# Patient Record
Sex: Female | Born: 1955 | Race: White | Hispanic: No | Marital: Married | Smoking: Never smoker
Health system: Southern US, Community
[De-identification: ages and names within clinical notes are randomized; demographics above are authoritative.]

## PROBLEM LIST (undated history)

## (undated) DIAGNOSIS — I10 Essential (primary) hypertension: Secondary | ICD-10-CM

## (undated) HISTORY — PX: BREAST CYST ASPIRATION: SHX578

## (undated) HISTORY — PX: ABDOMINAL HYSTERECTOMY: SHX81

## (undated) HISTORY — PX: BREAST BIOPSY: SHX20

---

## 2004-03-17 ENCOUNTER — Ambulatory Visit: Payer: Self-pay | Admitting: Obstetrics and Gynecology

## 2004-04-29 ENCOUNTER — Ambulatory Visit: Payer: Self-pay | Admitting: Unknown Physician Specialty

## 2005-05-04 ENCOUNTER — Ambulatory Visit: Payer: Self-pay | Admitting: Obstetrics and Gynecology

## 2006-05-10 ENCOUNTER — Ambulatory Visit: Payer: Self-pay | Admitting: Obstetrics and Gynecology

## 2007-05-12 ENCOUNTER — Ambulatory Visit: Payer: Self-pay | Admitting: Obstetrics and Gynecology

## 2008-06-06 ENCOUNTER — Ambulatory Visit: Payer: Self-pay

## 2009-06-19 ENCOUNTER — Ambulatory Visit: Payer: Self-pay

## 2010-06-23 ENCOUNTER — Ambulatory Visit: Payer: Self-pay

## 2011-06-23 ENCOUNTER — Ambulatory Visit: Payer: Self-pay | Admitting: Family Medicine

## 2011-06-25 ENCOUNTER — Ambulatory Visit: Payer: Self-pay

## 2012-07-12 ENCOUNTER — Ambulatory Visit: Payer: Self-pay

## 2012-08-26 ENCOUNTER — Ambulatory Visit: Payer: Self-pay | Admitting: Family Medicine

## 2012-08-26 LAB — URINALYSIS, COMPLETE
Bacteria: NEGATIVE
Bilirubin,UR: NEGATIVE
Blood: NEGATIVE
Ketone: NEGATIVE
Leukocyte Esterase: NEGATIVE
Nitrite: NEGATIVE
Ph: 6.5 (ref 4.5–8.0)
Protein: NEGATIVE

## 2012-08-26 LAB — COMPREHENSIVE METABOLIC PANEL
Anion Gap: 10 (ref 7–16)
BUN: 8 mg/dL (ref 7–18)
Bilirubin,Total: 0.7 mg/dL (ref 0.2–1.0)
Calcium, Total: 9.6 mg/dL (ref 8.5–10.1)
Chloride: 100 mmol/L (ref 98–107)
EGFR (Non-African Amer.): >60
Glucose: 103 mg/dL — ABNORMAL HIGH (ref 65–99)
Osmolality: 272 (ref 275–301)
Potassium: 3.8 mmol/L (ref 3.5–5.1)
Total Protein: 9.2 g/dL — ABNORMAL HIGH (ref 6.4–8.2)

## 2012-08-26 LAB — CBC WITH DIFFERENTIAL/PLATELET
Basophil #: 0 10*3/uL (ref 0.0–0.1)
Eosinophil %: 0.8 %
HCT: 42.1 % (ref 35.0–47.0)
HGB: 14.1 g/dL (ref 12.0–16.0)
Lymphocyte #: 2.4 10*3/uL (ref 1.0–3.6)
MCV: 88 fL (ref 80–100)
Monocyte #: 0.7 x10 3/mm (ref 0.2–0.9)
Monocyte %: 5.4 %
Neutrophil #: 9 10*3/uL — ABNORMAL HIGH (ref 1.4–6.5)
Neutrophil %: 73.6 %
Platelet: 264 10*3/uL (ref 150–440)
WBC: 12.2 10*3/uL — ABNORMAL HIGH (ref 3.6–11.0)

## 2012-08-26 LAB — LIPASE, BLOOD: Lipase: 114 U/L (ref 73–393)

## 2013-08-17 ENCOUNTER — Ambulatory Visit: Payer: Self-pay

## 2014-02-23 ENCOUNTER — Emergency Department: Payer: Self-pay | Admitting: Emergency Medicine

## 2014-02-23 LAB — BASIC METABOLIC PANEL
ANION GAP: 8 (ref 7–16)
BUN: 10 mg/dL (ref 7–18)
CO2: 27 mmol/L (ref 21–32)
Calcium, Total: 8.4 mg/dL — ABNORMAL LOW (ref 8.5–10.1)
Chloride: 100 mmol/L (ref 98–107)
Creatinine: 0.61 mg/dL (ref 0.60–1.30)
EGFR (African American): >60
EGFR (Non-African Amer.): >60
Glucose: 117 mg/dL — ABNORMAL HIGH (ref 65–99)
Osmolality: 270 (ref 275–301)
Potassium: 3.1 mmol/L — ABNORMAL LOW (ref 3.5–5.1)
Sodium: 135 mmol/L — ABNORMAL LOW (ref 136–145)

## 2014-02-23 LAB — CBC WITH DIFFERENTIAL/PLATELET
BASOS PCT: 0.9 %
Basophil #: 0.1 10*3/uL (ref 0.0–0.1)
EOS ABS: 0.1 10*3/uL (ref 0.0–0.7)
Eosinophil %: 1.5 %
HCT: 38.1 % (ref 35.0–47.0)
HGB: 13 g/dL (ref 12.0–16.0)
LYMPHS ABS: 3.6 10*3/uL (ref 1.0–3.6)
Lymphocyte %: 43.5 %
MCH: 30 pg (ref 26.0–34.0)
MCHC: 34.1 g/dL (ref 32.0–36.0)
MCV: 88 fL (ref 80–100)
MONO ABS: 0.5 x10 3/mm (ref 0.2–0.9)
Monocyte %: 5.9 %
Neutrophil #: 3.9 10*3/uL (ref 1.4–6.5)
Neutrophil %: 48.2 %
Platelet: 264 10*3/uL (ref 150–440)
RBC: 4.33 10*6/uL (ref 3.80–5.20)
RDW: 12.9 % (ref 11.5–14.5)
WBC: 8.2 10*3/uL (ref 3.6–11.0)

## 2014-02-23 LAB — TROPONIN I

## 2014-08-14 ENCOUNTER — Other Ambulatory Visit: Payer: Self-pay | Admitting: Nurse Practitioner

## 2014-08-14 DIAGNOSIS — Z1231 Encounter for screening mammogram for malignant neoplasm of breast: Secondary | ICD-10-CM

## 2014-08-21 ENCOUNTER — Ambulatory Visit: Admission: RE | Admit: 2014-08-21 | Payer: Self-pay | Source: Ambulatory Visit

## 2014-08-21 ENCOUNTER — Other Ambulatory Visit: Payer: Self-pay | Admitting: Nurse Practitioner

## 2014-08-21 ENCOUNTER — Ambulatory Visit
Admission: RE | Admit: 2014-08-21 | Discharge: 2014-08-21 | Disposition: A | Discharge status: Home / Self Care | Payer: 59 | Source: Ambulatory Visit | Attending: Nurse Practitioner | Admitting: Nurse Practitioner

## 2014-08-21 DIAGNOSIS — Z1231 Encounter for screening mammogram for malignant neoplasm of breast: Secondary | ICD-10-CM

## 2015-03-31 ENCOUNTER — Encounter: Admission: RE | Payer: Self-pay | Source: Ambulatory Visit

## 2015-03-31 ENCOUNTER — Ambulatory Visit: Admission: RE | Admit: 2015-03-31 | Payer: 59 | Source: Ambulatory Visit | Admitting: Gastroenterology

## 2015-03-31 SURGERY — COLONOSCOPY WITH PROPOFOL
Anesthesia: General

## 2015-08-27 ENCOUNTER — Other Ambulatory Visit: Payer: Self-pay | Admitting: Nurse Practitioner

## 2015-08-27 DIAGNOSIS — Z1231 Encounter for screening mammogram for malignant neoplasm of breast: Secondary | ICD-10-CM

## 2015-09-09 ENCOUNTER — Ambulatory Visit: Admission: RE | Admit: 2015-09-09 | Payer: 59 | Source: Ambulatory Visit

## 2015-09-11 ENCOUNTER — Ambulatory Visit
Admission: RE | Admit: 2015-09-11 | Discharge: 2015-09-11 | Disposition: A | Discharge status: Home / Self Care | Payer: 59 | Source: Ambulatory Visit | Attending: Nurse Practitioner | Admitting: Nurse Practitioner

## 2015-09-11 ENCOUNTER — Other Ambulatory Visit: Payer: Self-pay | Admitting: Nurse Practitioner

## 2015-09-11 DIAGNOSIS — Z1231 Encounter for screening mammogram for malignant neoplasm of breast: Secondary | ICD-10-CM

## 2016-02-27 DIAGNOSIS — Z79899 Other long term (current) drug therapy: Secondary | ICD-10-CM | POA: Diagnosis not present

## 2016-02-27 DIAGNOSIS — R7302 Impaired glucose tolerance (oral): Secondary | ICD-10-CM | POA: Diagnosis not present

## 2016-02-27 DIAGNOSIS — I10 Essential (primary) hypertension: Secondary | ICD-10-CM | POA: Diagnosis not present

## 2016-02-27 DIAGNOSIS — E7800 Pure hypercholesterolemia, unspecified: Secondary | ICD-10-CM | POA: Diagnosis not present

## 2016-03-03 DIAGNOSIS — M8588 Other specified disorders of bone density and structure, other site: Secondary | ICD-10-CM | POA: Diagnosis not present

## 2016-03-11 DIAGNOSIS — M159 Polyosteoarthritis, unspecified: Secondary | ICD-10-CM | POA: Diagnosis not present

## 2016-03-11 DIAGNOSIS — M542 Cervicalgia: Secondary | ICD-10-CM | POA: Diagnosis not present

## 2016-03-16 DIAGNOSIS — M159 Polyosteoarthritis, unspecified: Secondary | ICD-10-CM | POA: Diagnosis not present

## 2016-03-16 DIAGNOSIS — M542 Cervicalgia: Secondary | ICD-10-CM | POA: Diagnosis not present

## 2016-03-25 DIAGNOSIS — M159 Polyosteoarthritis, unspecified: Secondary | ICD-10-CM | POA: Diagnosis not present

## 2016-03-25 DIAGNOSIS — M542 Cervicalgia: Secondary | ICD-10-CM | POA: Diagnosis not present

## 2016-03-30 DIAGNOSIS — M542 Cervicalgia: Secondary | ICD-10-CM | POA: Diagnosis not present

## 2016-04-08 DIAGNOSIS — M542 Cervicalgia: Secondary | ICD-10-CM | POA: Diagnosis not present

## 2016-04-13 DIAGNOSIS — M542 Cervicalgia: Secondary | ICD-10-CM | POA: Diagnosis not present

## 2016-04-13 DIAGNOSIS — M159 Polyosteoarthritis, unspecified: Secondary | ICD-10-CM | POA: Diagnosis not present

## 2016-04-20 DIAGNOSIS — M542 Cervicalgia: Secondary | ICD-10-CM | POA: Diagnosis not present

## 2016-06-11 DIAGNOSIS — R194 Change in bowel habit: Secondary | ICD-10-CM | POA: Diagnosis not present

## 2016-09-06 ENCOUNTER — Other Ambulatory Visit: Payer: Self-pay | Admitting: Nurse Practitioner

## 2016-09-06 DIAGNOSIS — Z1231 Encounter for screening mammogram for malignant neoplasm of breast: Secondary | ICD-10-CM

## 2016-09-15 ENCOUNTER — Ambulatory Visit
Admission: RE | Admit: 2016-09-15 | Discharge: 2016-09-15 | Disposition: A | Discharge status: Home / Self Care | Payer: 59 | Source: Ambulatory Visit | Attending: Nurse Practitioner | Admitting: Nurse Practitioner

## 2016-09-15 DIAGNOSIS — Z1231 Encounter for screening mammogram for malignant neoplasm of breast: Secondary | ICD-10-CM | POA: Diagnosis present

## 2017-03-09 DIAGNOSIS — R7302 Impaired glucose tolerance (oral): Secondary | ICD-10-CM | POA: Diagnosis not present

## 2017-03-09 DIAGNOSIS — I10 Essential (primary) hypertension: Secondary | ICD-10-CM | POA: Diagnosis not present

## 2017-03-09 DIAGNOSIS — Z79899 Other long term (current) drug therapy: Secondary | ICD-10-CM | POA: Diagnosis not present

## 2017-03-09 DIAGNOSIS — E7800 Pure hypercholesterolemia, unspecified: Secondary | ICD-10-CM | POA: Diagnosis not present

## 2017-09-06 ENCOUNTER — Other Ambulatory Visit: Payer: Self-pay | Admitting: Nurse Practitioner

## 2017-09-06 DIAGNOSIS — Z1231 Encounter for screening mammogram for malignant neoplasm of breast: Secondary | ICD-10-CM

## 2017-10-18 ENCOUNTER — Encounter (INDEPENDENT_AMBULATORY_CARE_PROVIDER_SITE_OTHER): Payer: Self-pay

## 2017-10-18 ENCOUNTER — Ambulatory Visit
Admission: RE | Admit: 2017-10-18 | Discharge: 2017-10-18 | Disposition: A | Discharge status: Home / Self Care | Payer: 59 | Source: Ambulatory Visit | Attending: Nurse Practitioner | Admitting: Nurse Practitioner

## 2017-10-18 DIAGNOSIS — Z1231 Encounter for screening mammogram for malignant neoplasm of breast: Secondary | ICD-10-CM | POA: Insufficient documentation

## 2018-09-13 ENCOUNTER — Other Ambulatory Visit: Payer: Self-pay | Admitting: Nurse Practitioner

## 2018-09-13 DIAGNOSIS — Z1231 Encounter for screening mammogram for malignant neoplasm of breast: Secondary | ICD-10-CM

## 2018-10-23 ENCOUNTER — Other Ambulatory Visit: Payer: Self-pay

## 2018-10-23 ENCOUNTER — Ambulatory Visit
Admission: RE | Admit: 2018-10-23 | Discharge: 2018-10-23 | Disposition: A | Discharge status: Home / Self Care | Payer: 59 | Source: Ambulatory Visit | Attending: Nurse Practitioner | Admitting: Nurse Practitioner

## 2018-10-23 DIAGNOSIS — Z1231 Encounter for screening mammogram for malignant neoplasm of breast: Secondary | ICD-10-CM | POA: Diagnosis not present

## 2019-10-09 ENCOUNTER — Other Ambulatory Visit: Payer: Self-pay | Admitting: Nurse Practitioner

## 2019-10-09 DIAGNOSIS — Z1231 Encounter for screening mammogram for malignant neoplasm of breast: Secondary | ICD-10-CM

## 2019-10-24 ENCOUNTER — Ambulatory Visit: Payer: 59

## 2019-12-17 ENCOUNTER — Other Ambulatory Visit: Payer: Self-pay

## 2019-12-17 ENCOUNTER — Ambulatory Visit
Admission: RE | Admit: 2019-12-17 | Discharge: 2019-12-17 | Disposition: A | Discharge status: Home / Self Care | Payer: Medicare Other | Source: Ambulatory Visit | Attending: Nurse Practitioner | Admitting: Nurse Practitioner

## 2019-12-17 DIAGNOSIS — Z1231 Encounter for screening mammogram for malignant neoplasm of breast: Secondary | ICD-10-CM | POA: Diagnosis not present

## 2020-08-02 ENCOUNTER — Other Ambulatory Visit: Payer: Self-pay

## 2020-08-02 ENCOUNTER — Encounter: Payer: Self-pay | Admitting: Emergency Medicine

## 2020-08-02 ENCOUNTER — Ambulatory Visit
Admission: EM | Admit: 2020-08-02 | Discharge: 2020-08-02 | Disposition: A | Discharge status: Home / Self Care | Payer: Medicare Other | Attending: Family Medicine | Admitting: Family Medicine

## 2020-08-02 DIAGNOSIS — J0100 Acute maxillary sinusitis, unspecified: Secondary | ICD-10-CM

## 2020-08-02 MED ORDER — AMOXICILLIN-POT CLAVULANATE 875-125 MG PO TABS: 1.0000 | ORAL_TABLET | Freq: Two times a day (BID) | ORAL | 0 refills | Status: DC

## 2020-08-02 NOTE — ED Triage Notes (Signed)
Patient c/o sinus congestion and pressure that started 3 weeks ago.  Patient states that last night her teeth started hurting. Patient denies fevers.

## 2020-08-02 NOTE — ED Provider Notes (Signed)
[facility]    CSN: [id number] Arrival date & time: 08/02/20  0906      History   Chief Complaint Chief Complaint  Patient presents with   Sinus Problem    HPI 65 year old female presents with the above complaint.  3 week history of sinus pain, pressure, and congestion.  She has been using a Nettie pot without resolution.  She is now experiencing some associated dental pain.  Denies fever.  Feels poorly.  No known inciting factor.  No known exacerbating factors.  No other respiratory symptoms.  No other complaints.  Past Surgical History:  Procedure Laterality Date   ABDOMINAL HYSTERECTOMY     BREAST BIOPSY Right    neg   BREAST CYST ASPIRATION Left    neg    OB History   No obstetric history on file.      Home Medications    Prior to Admission medications   Medication Sig Start Date End Date Taking? Authorizing Provider  amoxicillin-clavulanate (AUGMENTIN) 875-125 MG tablet Take 1 tablet by mouth 2 (two) times daily. 08/02/20  Yes Wadsworth Skolnick G, DO  lisinopril (ZESTRIL) 20 MG tablet Take 20 mg by mouth 2 (two) times daily. 07/12/20  Yes [provider]  omeprazole (PRILOSEC OTC) 20 MG tablet Take by mouth.   Yes [provider]  traMADol (ULTRAM) 50 MG tablet Take 50 mg by mouth 2 (two) times daily as needed. 05/27/20  Yes [provider]    Family History Family History  Problem Relation Age of Onset   Breast cancer Sister 73    Social History Social History   Tobacco Use   Smoking status: Never   Smokeless tobacco: Never  Vaping Use   Vaping Use: Never used  Substance Use Topics   Alcohol use: Not Currently     Allergies   Statins   Review of Systems Review of Systems  Constitutional:  Negative for fever.  HENT:  Positive for congestion, sinus pressure and sinus pain.     Physical Exam Triage Vital Signs ED Triage Vitals  Enc Vitals Group     BP 08/02/20 0921 (!) 173/97     Pulse Rate 08/02/20 0921  (!) 113     Resp 08/02/20 0921 14     Temp 08/02/20 0921 98 F (36.7 C)     Temp Source 08/02/20 0921 Oral     SpO2 08/02/20 0921 100 %     Weight 08/02/20 0917 141 lb (64 kg)     Height 08/02/20 0917 5\' 5"  (1.651 m)     Head Circumference --      Peak Flow --      Pain Score 08/02/20 0917 7     Pain Loc --      Pain Edu? --      Excl. in GC? --    Updated Vital Signs BP (!) 173/97 (BP Location: Left Arm) Comment: Patient has not taken her BP medicine today  Pulse (!) 113   Temp 98 F (36.7 C) (Oral)   Resp 14   Ht 5\' 5"  (1.651 m)   Wt 64 kg   SpO2 100%   BMI 23.46 kg/m   Visual Acuity Right Eye Distance:   Left Eye Distance:   Bilateral Distance:    Right Eye Near:   Left Eye Near:    Bilateral Near:     Physical Exam Vitals and nursing note reviewed.  Constitutional:      General: She is  not in acute distress.    Appearance: Normal appearance.  HENT:     Head: Normocephalic and atraumatic.     Right Ear: Tympanic membrane normal.     Left Ear: Tympanic membrane normal.     Nose: Congestion present.     Comments: Maxillary sinus tenderness to palpation. Eyes:     General:        Right eye: No discharge.        Left eye: No discharge.     Conjunctiva/sclera: Conjunctivae normal.  Cardiovascular:     Rate and Rhythm: Regular rhythm. Tachycardia present.  Pulmonary:     Effort: Pulmonary effort is normal.     Breath sounds: Normal breath sounds. No wheezing, rhonchi or rales.  Neurological:     Mental Status: She is alert.  Psychiatric:        Mood and Affect: Mood normal.        Behavior: Behavior normal.     UC Treatments / Results  Labs (all labs ordered are listed, but only abnormal results are displayed) Labs Reviewed - No data to display  EKG   Radiology No results found.  Procedures Procedures (including critical care time)  Medications Ordered in UC Medications - No data to display  Initial Impression / Assessment and Plan / UC  Course  I have reviewed the triage vital signs and the nursing notes.  Pertinent labs & imaging results that were available during my care of the patient were reviewed by me and considered in my medical decision making (see chart for details).    65 year old female presents with sinusitis. Treating with Augmentin.   Final Clinical Impressions(s) / UC Diagnoses   Final diagnoses:  Acute maxillary sinusitis, recurrence not specified   Discharge Instructions   None    ED Prescriptions     Medication Sig Dispense Auth. Provider   amoxicillin-clavulanate (AUGMENTIN) 875-125 MG tablet Take 1 tablet by mouth 2 (two) times daily. 20 tablet Tommie Sams, DO      PDMP not reviewed this encounter.   Tommie Sams, DO 08/02/20 1019

## 2020-10-27 ENCOUNTER — Other Ambulatory Visit: Payer: Self-pay | Admitting: Nurse Practitioner

## 2020-10-27 DIAGNOSIS — Z1231 Encounter for screening mammogram for malignant neoplasm of breast: Secondary | ICD-10-CM

## 2020-12-17 ENCOUNTER — Ambulatory Visit: Payer: Medicare Other

## 2021-02-09 ENCOUNTER — Other Ambulatory Visit: Payer: Self-pay

## 2021-02-09 ENCOUNTER — Ambulatory Visit
Admission: RE | Admit: 2021-02-09 | Discharge: 2021-02-09 | Disposition: A | Discharge status: Home / Self Care | Payer: Medicare Other | Source: Ambulatory Visit | Attending: Nurse Practitioner | Admitting: Nurse Practitioner

## 2021-02-09 DIAGNOSIS — Z1231 Encounter for screening mammogram for malignant neoplasm of breast: Secondary | ICD-10-CM | POA: Diagnosis present

## 2021-04-15 ENCOUNTER — Ambulatory Visit
Admission: EM | Admit: 2021-04-15 | Discharge: 2021-04-15 | Payer: Medicare Other | Attending: Student | Admitting: Student

## 2021-04-15 ENCOUNTER — Other Ambulatory Visit: Payer: Self-pay

## 2021-04-15 ENCOUNTER — Emergency Department: Payer: Medicare Other

## 2021-04-15 ENCOUNTER — Emergency Department
Admission: EM | Admit: 2021-04-15 | Discharge: 2021-04-15 | Disposition: A | Discharge status: Home / Self Care | Payer: Medicare Other | Attending: Emergency Medicine | Admitting: Emergency Medicine

## 2021-04-15 ENCOUNTER — Encounter: Payer: Self-pay | Admitting: Intensive Care

## 2021-04-15 DIAGNOSIS — G8929 Other chronic pain: Secondary | ICD-10-CM

## 2021-04-15 DIAGNOSIS — R0789 Other chest pain: Secondary | ICD-10-CM | POA: Diagnosis present

## 2021-04-15 DIAGNOSIS — R002 Palpitations: Secondary | ICD-10-CM | POA: Insufficient documentation

## 2021-04-15 DIAGNOSIS — I10 Essential (primary) hypertension: Secondary | ICD-10-CM | POA: Insufficient documentation

## 2021-04-15 DIAGNOSIS — M25512 Pain in left shoulder: Secondary | ICD-10-CM

## 2021-04-15 DIAGNOSIS — Z79899 Other long term (current) drug therapy: Secondary | ICD-10-CM | POA: Diagnosis not present

## 2021-04-15 DIAGNOSIS — R079 Chest pain, unspecified: Secondary | ICD-10-CM

## 2021-04-15 DIAGNOSIS — R791 Abnormal coagulation profile: Secondary | ICD-10-CM | POA: Diagnosis not present

## 2021-04-15 DIAGNOSIS — I160 Hypertensive urgency: Secondary | ICD-10-CM | POA: Diagnosis not present

## 2021-04-15 HISTORY — DX: Essential (primary) hypertension: I10

## 2021-04-15 LAB — BASIC METABOLIC PANEL
Anion gap: 11 (ref 5–15)
BUN: 9 mg/dL (ref 8–23)
CO2: 24 mmol/L (ref 22–32)
Calcium: 10.3 mg/dL (ref 8.9–10.3)
Chloride: 102 mmol/L (ref 98–111)
Creatinine, Ser: 0.6 mg/dL (ref 0.44–1.00)
GFR, Estimated: >60 mL/min (ref >60)
Glucose, Bld: 114 mg/dL — ABNORMAL HIGH (ref 70–99)
Potassium: 3.6 mmol/L (ref 3.5–5.1)
Sodium: 137 mmol/L (ref 135–145)

## 2021-04-15 LAB — TROPONIN I (HIGH SENSITIVITY)
Troponin I (High Sensitivity): 5 ng/L (ref <18)
Troponin I (High Sensitivity): 7 ng/L (ref <18)

## 2021-04-15 LAB — T4, FREE: Free T4: 0.72 ng/dL (ref 0.61–1.12)

## 2021-04-15 LAB — D-DIMER, QUANTITATIVE: D-Dimer, Quant: 0.46 ug/mL-FEU (ref 0.00–0.50)

## 2021-04-15 LAB — CBC
HCT: 38.9 % (ref 36.0–46.0)
Hemoglobin: 13 g/dL (ref 12.0–15.0)
MCH: 29.5 pg (ref 26.0–34.0)
MCHC: 33.4 g/dL (ref 30.0–36.0)
MCV: 88.4 fL (ref 80.0–100.0)
Platelets: 287 10*3/uL (ref 150–400)
RBC: 4.4 MIL/uL (ref 3.87–5.11)
RDW: 12.8 % (ref 11.5–15.5)
WBC: 9 10*3/uL (ref 4.0–10.5)
nRBC: 0 % (ref 0.0–0.2)

## 2021-04-15 LAB — TSH: TSH: 4.726 u[IU]/mL — ABNORMAL HIGH (ref 0.350–4.500)

## 2021-04-15 NOTE — ED Notes (Signed)
Patient is being discharged from the Urgent Care and sent to the Emergency Department via POV . Per Cheree Ditto, [place] , patient is in need of  higher level of care due to abnormal EKG requiring further work-up. Patient is aware and verbalizes understanding of plan of care.  ?Vitals:  ? 04/15/21 1058  ?BP: (S) (!) 180/87  ?Pulse: 66  ?Resp: 16  ?Temp: 97.6 ?F (36.4 ?C)  ?SpO2: 100%  ?  ?

## 2021-04-15 NOTE — Discharge Instructions (Addendum)
As we discussed your TSH or thyroid hormone is slightly elevated.  I will send a free T4 study with your primary care doctor can follow-up to see if this needs any additional tests or treatment.  Also please have your blood pressure rechecked in 2 or 3 days and return immediately to the emergency room for any new or worsening of your symptoms. ?

## 2021-04-15 NOTE — ED Triage Notes (Signed)
Patient c/o stabbing under left breast pain that started today. Also reports left neck into shoulder blade and shoulder pain. Reports hx neck issues. Took tylenol and tums with no relief. Reports feeling heart flutter for months intermittently- comes and goes quickly ?

## 2021-04-15 NOTE — ED Provider Notes (Signed)
? ?[facility] ?Provider Note ? ? ? Event Date/Time  ? First MD Initiated Contact with Patient 04/15/21 1601   ?  (approximate) ? ? ?History  ? ?Chest Pain ? ? ?HPI ? ?Cassandra Collier is a 66 y.o. female with a past medical history of hypertension on lisinopril 20 mg twice daily without any recently missed doses as well as some chronic fluttering in the chest reported by patient going back years presents for evaluation of some fluttering in seen more persistent today associated with some lower chest tightness with deep breathing and some soreness in her left shoulder.  She states this started this morning.  She took some Tylenol and her symptoms resolved after about an hour.  She was seen in urgent care and referred to the emergency room because some abnormalities were noted on her ECG.  She states she is currently symptom-free.  No injuries or falls.  No headache, earache, sore throat, dizziness, cough, shortness of breath, fevers, nausea, vomiting, diarrhea, urinary symptoms, abdominal pain or any extremity focal weakness numbness or tingling.  No prior similar episodes.  She also took some Tums because she was not sure if it was related to reflux. ? ?  ?Past Medical History:  ?Diagnosis Date  ? Hypertension   ? ? ? ?Physical Exam  ?Triage Vital Signs: ?ED Triage Vitals  ?Enc Vitals Group  ?   BP 04/15/21 1302 (!) 184/97  ?   Pulse Rate 04/15/21 1302 76  ?   Resp 04/15/21 1302 18  ?   Temp 04/15/21 1302 98.4 ?F (36.9 ?C)  ?   Temp Source 04/15/21 1302 Oral  ?   SpO2 04/15/21 1302 96 %  ?   Weight 04/15/21 1315 143 lb (64.9 kg)  ?   Height 04/15/21 1315 5\' 5"  (1.651 m)  ?   Head Circumference --   ?   Peak Flow --   ?   Pain Score 04/15/21 1315 2  ?   Pain Loc --   ?   Pain Edu? --   ?   Excl. in GC? --   ? ? ?Most recent vital signs: ?Vitals:  ? 04/15/21 1302 04/15/21 1625  ?BP: (!) 184/97 (!) 180/89  ?Pulse: 76 78  ?Resp: 18 16  ?Temp: 98.4 ?F (36.9 ?C)   ?SpO2: 96% 100%   ? ? ?General: Awake, no distress.  ?CV:  Good peripheral perfusion.  2+ radial pulses.  No murmur. ?Resp:  Normal effort.  Clear bilaterally. ?Abd:  No distention.  Soft. ?Other:  Cranial nerves II through XII grossly intact.  No pronator drift.  No finger dysmetria.  Symmetric 5/5 strength of all extremities.  Sensation intact to light touch in all extremities.  Unremarkable unassisted gait. ? ? ? ?ED Results / Procedures / Treatments  ?Labs ?(all labs ordered are listed, but only abnormal results are displayed) ?Labs Reviewed  ?BASIC METABOLIC PANEL - Abnormal; Notable for the following components:  ?    Result Value  ? Glucose, Bld 114 (*)   ? All other components within normal limits  ?TSH - Abnormal; Notable for the following components:  ? TSH 4.726 (*)   ? All other components within normal limits  ?CBC  ?D-DIMER, QUANTITATIVE  ?T4, FREE  ?TROPONIN I (HIGH SENSITIVITY)  ?TROPONIN I (HIGH SENSITIVITY)  ? ? ? ?EKG ? ?ECG is remarkable for sinus rhythm with a ventricular rate of 73, normal axis between -30 and 0 degrees, incomplete right  bundle branch block, nonspecific ST change in lead II, aVF as well as some subtle ST depressions in lateral leads V5 and V6 as well as V3. ? ? ?RADIOLOGY ?Chest reviewed by myself shows no focal consoidation, effusion, edema, pneumothorax or other clear acute thoracic process. I also reviewed radiology interpretation and agree with findings described. ? ? ? ?PROCEDURES: ? ?Critical Care performed: No ? ?Procedures ? ? ? ?MEDICATIONS ORDERED IN ED: ?Medications - No data to display ? ? ?IMPRESSION / MDM / ASSESSMENT AND PLAN / ED COURSE  ?I reviewed the triage vital signs and the nursing notes. ?             ?               ? ?Differential diagnosis includes, but is not limited to symptomatic hypertension, ACS, PE given she describes a pleuritic nature to her chest tightness, pneumonia, orchitis, anemia, esophageal spasm or GERD, possible anemia or hypothyroidism contributing to  the fluttering.  She is symptom-free and overall I have a lower suspicion for dissection at this time given overall description of symptoms is some tightness with breathing and some soreness in the shoulder without any ripping or tearing or ongoing pain. ? ?ECG is remarkable for sinus rhythm with a ventricular rate of 73, normal axis between -30 and 0 degrees, incomplete right bundle branch block, nonspecific ST change in lead II, aVF as well as some subtle ST depressions in lateral leads V5 and V6 as well as V3.  Given nonelevated troponin x2 have a very low suspicion for ACS or myocarditis. ? ?Chest reviewed by myself shows no focal consoidation, effusion, edema, pneumothorax or other clear acute thoracic process. I also reviewed radiology interpretation and agree with findings described. ? ?D-dimer is 0.46 and not suggestive of a PE.  CBC without leukocytosis or acute anemia.  BMP without any significant electrolyte or metabolic derangements.  Free T4 slightly above upper limit of normal at 4.726.  Advised patient we can send a free T4 she can follow-up with her PCP.  I do not think she was thyrotoxic at this time.  All her blood pressure is elevated with systolic of 180 she does not have any evidence of endorgan damage at this time and throughout her 4 and half hours in the emergency room has been asymptomatic.  At this point while considered observation I think she is stable for continued outpatient evaluation and to have her blood pressure rechecked.  Discussed importance of close outpatient PCP follow-up and returning immediately for any new or worsening symptoms.  Discharged in stable condition.  Strict precautions advised and discussed. ? ?  ? ? ?FINAL CLINICAL IMPRESSION(S) / ED DIAGNOSES  ? ?Final diagnoses:  ?Chest pain, unspecified type  ?Hypertension, unspecified type  ?Palpitations  ? ? ? ?Rx / DC Orders  ? ?ED Discharge Orders   ? ? None  ? ?  ? ? ? ?Note:  This document was prepared using Dragon  voice recognition software and may include unintentional dictation errors. ?  ?Gilles Chiquito, MD ?04/15/21 1718 ? ?

## 2021-04-15 NOTE — ED Triage Notes (Signed)
Patient presents to Urgent Care with complaints of on-going heart flutter since 2 months. Left arm since today. Pt states PCP is aware of heart fluttering. She states she is unsure if arm pain is from her chronic neck pain. Pt states she has been outdoors working in her yard, unsure if she caused her neck pain to flare-up. Treating symptoms with tums, prilosec, and tylenol.  ? ?Denies chest pain, N/V.  ?

## 2021-04-15 NOTE — Discharge Instructions (Addendum)
-  There are some small changes in your EKG, I am not sure if this is normal for you as we do not have a prior EKG for comparison.  I cannot completely rule out a cardiac event in the urgent care setting, though your exam is reassuring today.  The safest option is to head to the emergency department today for this evaluation.  In the future, your primary care provider can help you get in with a cardiologist who can work you up outpatient. ?

## 2021-04-15 NOTE — ED Provider Notes (Signed)
?[facility] ? ? ? ?CSN: [id number] ?Arrival date & time: 04/15/21  1047 ? ? ?  ? ?History   ?Chief Complaint ?Chief Complaint  ?Patient presents with  ? Arm Pain  ? Chest Pain  ? Gastroesophageal Reflux  ? Shoulder Pain  ? ? ?HPI ?Cassandra Collier is a 66 y.o. female presenting with heart fluttering.  History noncontributory.  States that she has had episodes of heart fluttering for the last 2 months, last episode of this was 1 day ago and terminated on its own in less than 1 minute.  However, today she woke up with some left shoulder and arm pain, as well as indigestion.  She does have chronic neck pain, and attributes the pain to that.  Took some Tums for the indigestion which helped.  States that her primary care has never done an EKG, and told her that the fluttering was because she "keeps forgetting to breathe".  Denies chest pain, shortness of breath, dizziness, weakness. ? ?HPI ? ?History reviewed. No pertinent past medical history. ? ?There are no problems to display for this patient. ? ? ?Past Surgical History:  ?Procedure Laterality Date  ? ABDOMINAL HYSTERECTOMY    ? BREAST BIOPSY Right   ? neg  ? BREAST CYST ASPIRATION Left   ? neg  ? ? ?OB History   ?No obstetric history on file. ?  ? ? ? ?Home Medications   ? ?Prior to Admission medications   ?Medication Sig Start Date End Date Taking? Authorizing Provider  ?amoxicillin-clavulanate (AUGMENTIN) 875-125 MG tablet Take 1 tablet by mouth 2 (two) times daily. 08/02/20   Tommie Sams, DO  ?lisinopril (ZESTRIL) 20 MG tablet Take 20 mg by mouth 2 (two) times daily. 07/12/20   [provider]  ?omeprazole (PRILOSEC OTC) 20 MG tablet Take by mouth.    [provider]  ?traMADol (ULTRAM) 50 MG tablet Take 50 mg by mouth 2 (two) times daily as needed. 05/27/20   [provider]  ? ? ?Family History ?Family History  ?Problem Relation Age of Onset  ? Breast cancer Sister 52  ? ? ?Social History ?Social History  ? ?Tobacco  Use  ? Smoking status: Never  ? Smokeless tobacco: Never  ?Vaping Use  ? Vaping Use: Never used  ?Substance Use Topics  ? Alcohol use: Not Currently  ? ? ? ?Allergies   ?Statins ? ? ?Review of Systems ?Review of Systems  ?Cardiovascular:  Positive for palpitations.  ?All other systems reviewed and are negative. ? ? ?Physical Exam ?Triage Vital Signs ?ED Triage Vitals  ?Enc Vitals Group  ?   BP 04/15/21 1058 (S) (!) 180/87  ?   Pulse Rate 04/15/21 1058 66  ?   Resp 04/15/21 1058 16  ?   Temp 04/15/21 1058 97.6 ?F (36.4 ?C)  ?   Temp Source 04/15/21 1058 Oral  ?   SpO2 04/15/21 1058 100 %  ?   Weight --   ?   Height --   ?   Head Circumference --   ?   Peak Flow --   ?   Pain Score 04/15/21 1102 4  ?   Pain Loc --   ?   Pain Edu? --   ?   Excl. in GC? --   ? ?No data found. ? ?Updated Vital Signs ?BP (S) (!) 180/87 (BP Location: Left Arm)   Pulse 66   Temp 97.6 ?F (36.4 ?C) (Oral)   Resp  16   SpO2 100%  ? ?Visual Acuity ?Right Eye Distance:   ?Left Eye Distance:   ?Bilateral Distance:   ? ?Right Eye Near:   ?Left Eye Near:    ?Bilateral Near:    ? ?Physical Exam ?Vitals reviewed.  ?Constitutional:   ?   Appearance: Normal appearance. She is not diaphoretic.  ?HENT:  ?   Head: Normocephalic and atraumatic.  ?   Mouth/Throat:  ?   Mouth: Mucous membranes are moist.  ?Eyes:  ?   Extraocular Movements: Extraocular movements intact.  ?   Pupils: Pupils are equal, round, and reactive to light.  ?Cardiovascular:  ?   Rate and Rhythm: Normal rate and regular rhythm.  ?   Pulses:     ?     Radial pulses are 2+ on the right side and 2+ on the left side.  ?   Heart sounds: Normal heart sounds.  ?Pulmonary:  ?   Effort: Pulmonary effort is normal.  ?   Breath sounds: Normal breath sounds.  ?Abdominal:  ?   Palpations: Abdomen is soft.  ?   Tenderness: There is no abdominal tenderness. There is no guarding or rebound.  ?   Comments: No reproducible pain   ?Musculoskeletal:  ?   Right lower leg: No edema.  ?   Left lower leg:  No edema.  ?   Comments: L shoulder - pain is not reproducible.   ?Skin: ?   General: Skin is warm.  ?   Capillary Refill: Capillary refill takes less than 2 seconds.  ?Neurological:  ?   General: No focal deficit present.  ?   Mental Status: She is alert and oriented to person, place, and time.  ?Psychiatric:     ?   Mood and Affect: Mood normal.     ?   Behavior: Behavior normal.     ?   Thought Content: Thought content normal.     ?   Judgment: Judgment normal.  ? ? ? ?UC Treatments / Results  ?Labs ?(all labs ordered are listed, but only abnormal results are displayed) ?Labs Reviewed - No data to display ? ?EKG ? ? ?Radiology ?No results found. ? ?Procedures ?Procedures (including critical care time) ? ?Medications Ordered in UC ?Medications - No data to display ? ?Initial Impression / Assessment and Plan / UC Course  ?I have reviewed the triage vital signs and the nursing notes. ? ?Pertinent labs & imaging results that were available during my care of the patient were reviewed by me and considered in my medical decision making (see chart for details). ? ?  ? ?This patient is a very pleasant 66 y.o. year old female presenting with L shoulder pain; heart fluttering; indigestion.  ? ?EKG with t wave changes in one lead; no prior study for comparison. Shoulder pain is not reproducible.  ? ?Discussed that while exam is reassuring today, cannot exclude ACS in the urgent care setting. Rec ED f/u today. She verbalizes understanding. Declines transport via EMS.  ? ?Final Clinical Impressions(s) / UC Diagnoses  ? ?Final diagnoses:  ?Chronic left shoulder pain  ?Hypertensive urgency  ? ? ? ?Discharge Instructions   ? ?  ?-There are some small changes in your EKG, I am not sure if this is normal for you as we do not have a prior EKG for comparison.  I cannot completely rule out a cardiac event in the urgent care setting, though your exam is reassuring today.  The  safest option is to head to the emergency department today  for this evaluation.  In the future, your primary care provider can help you get in with a cardiologist who can work you up outpatient. ? ? ?ED Prescriptions   ?None ?  ? ?PDMP not reviewed this encounter. ?  ?Rhys Martini, PA-C ?04/15/21 1229 ? ?

## 2022-02-09 ENCOUNTER — Other Ambulatory Visit: Payer: Self-pay | Admitting: Nurse Practitioner

## 2022-02-09 DIAGNOSIS — Z1231 Encounter for screening mammogram for malignant neoplasm of breast: Secondary | ICD-10-CM

## 2022-03-15 ENCOUNTER — Ambulatory Visit
Admission: RE | Admit: 2022-03-15 | Discharge: 2022-03-15 | Disposition: A | Discharge status: Home / Self Care | Payer: Medicare Other | Source: Ambulatory Visit | Attending: Nurse Practitioner | Admitting: Nurse Practitioner

## 2022-03-15 DIAGNOSIS — Z1231 Encounter for screening mammogram for malignant neoplasm of breast: Secondary | ICD-10-CM | POA: Insufficient documentation

## 2023-04-13 ENCOUNTER — Other Ambulatory Visit: Payer: Self-pay | Admitting: Nurse Practitioner

## 2023-04-13 DIAGNOSIS — Z1231 Encounter for screening mammogram for malignant neoplasm of breast: Secondary | ICD-10-CM

## 2023-04-27 ENCOUNTER — Ambulatory Visit
Admission: RE | Admit: 2023-04-27 | Discharge: 2023-04-27 | Disposition: A | Discharge status: Home / Self Care | Source: Ambulatory Visit | Attending: Nurse Practitioner | Admitting: Nurse Practitioner

## 2023-04-27 DIAGNOSIS — Z1231 Encounter for screening mammogram for malignant neoplasm of breast: Secondary | ICD-10-CM | POA: Diagnosis present

## 2023-11-19 IMAGING — MG MM DIGITAL SCREENING BILAT W/ TOMO AND CAD
8 series · 8 of 24 positions shown · non-contrast
Comparison: Previous exam(s).

CLINICAL DATA: Screening.

EXAM:
DIGITAL SCREENING BILATERAL MAMMOGRAM WITH TOMOSYNTHESIS AND CAD
TECHNIQUE: Bilateral screening digital craniocaudal and mediolateral oblique
mammograms were obtained. Bilateral screening digital breast
tomosynthesis was performed. The images were evaluated with
computer-aided detection.

[L MLO synth-2D]
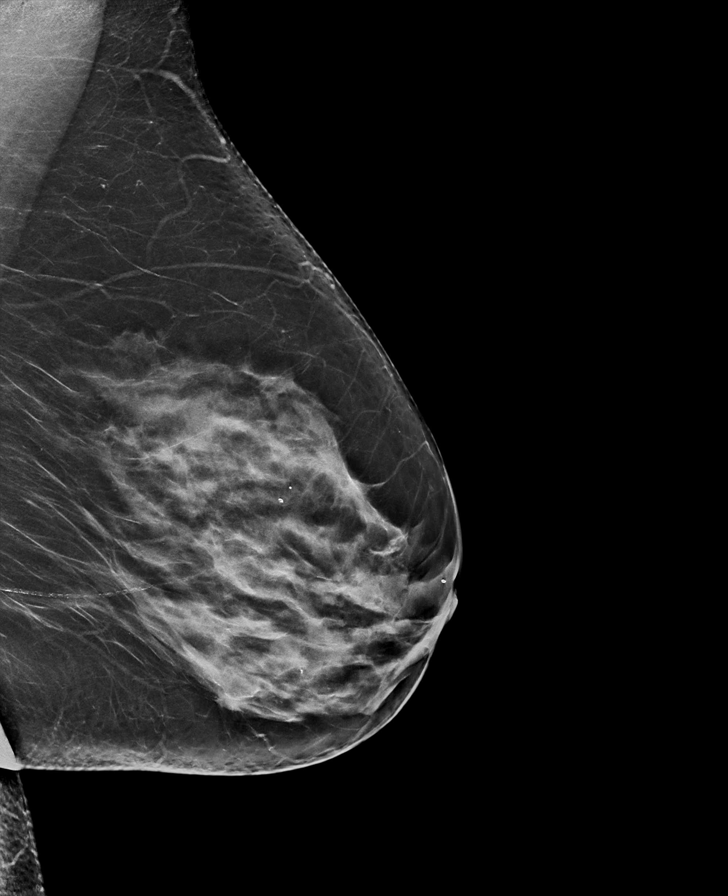

[L CC synth-2D]
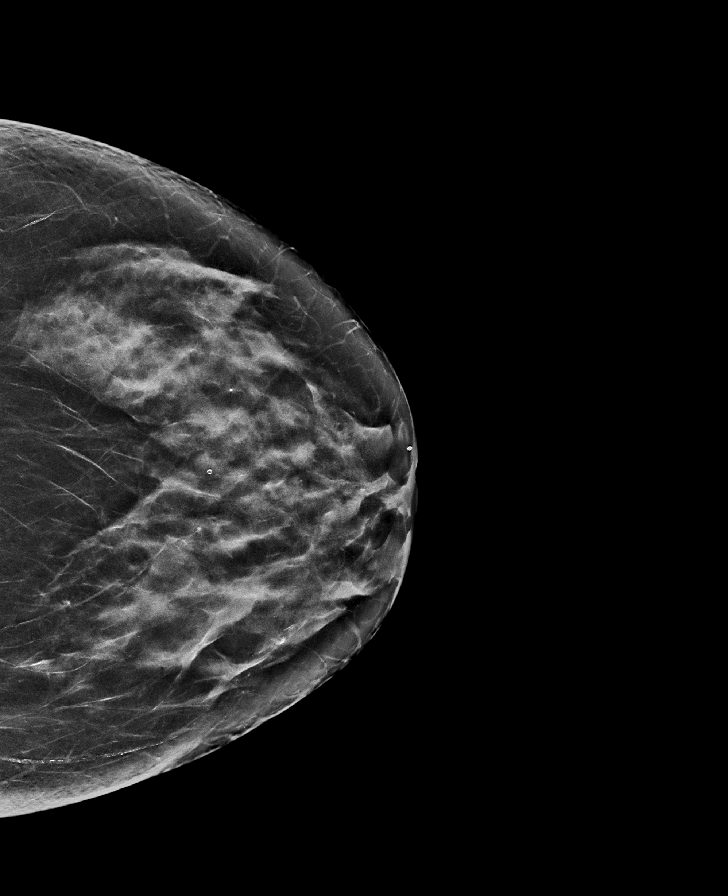

[R MLO synth-2D]
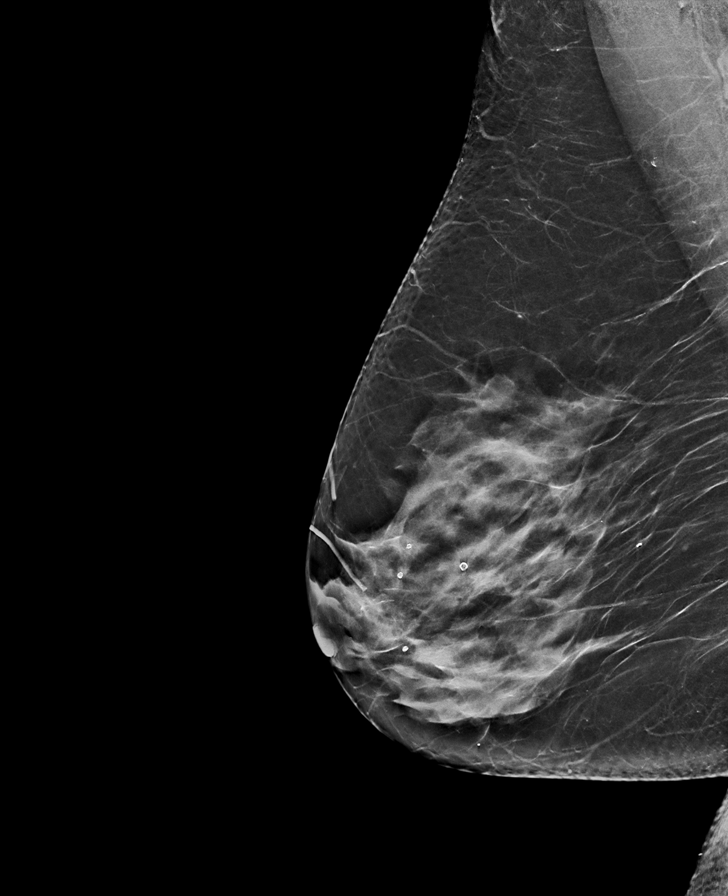

[R CC synth-2D]
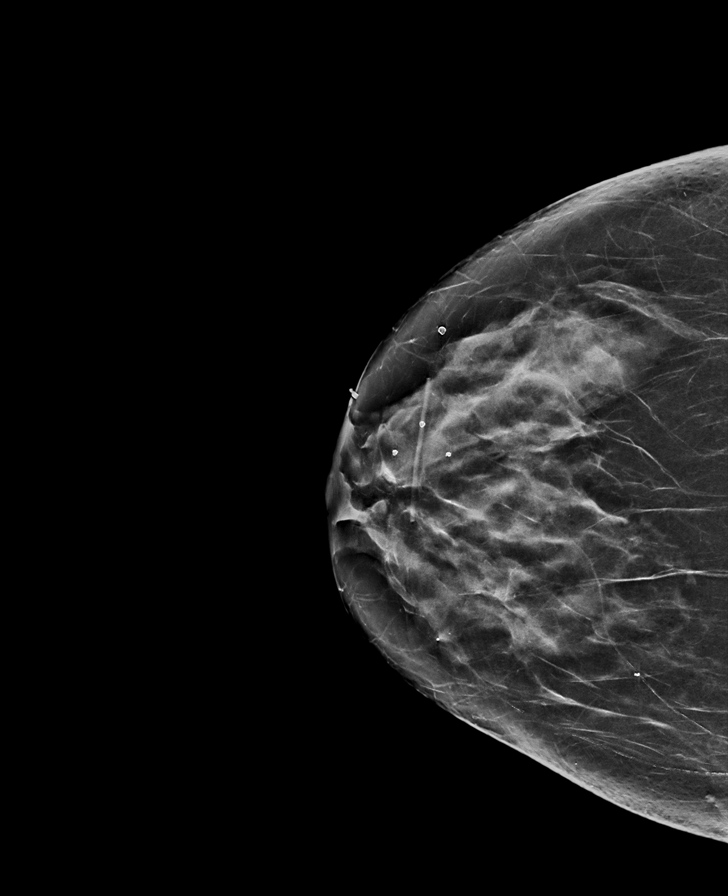

[R CC tomo · tomo slice 37/72.0]
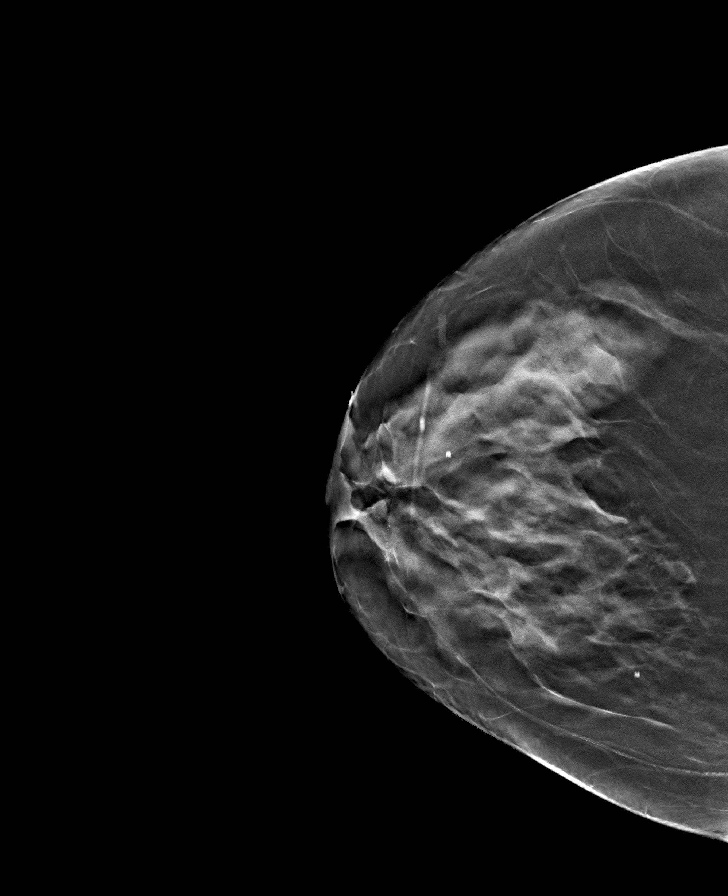

[R MLO tomo · tomo slice 37/74.0]
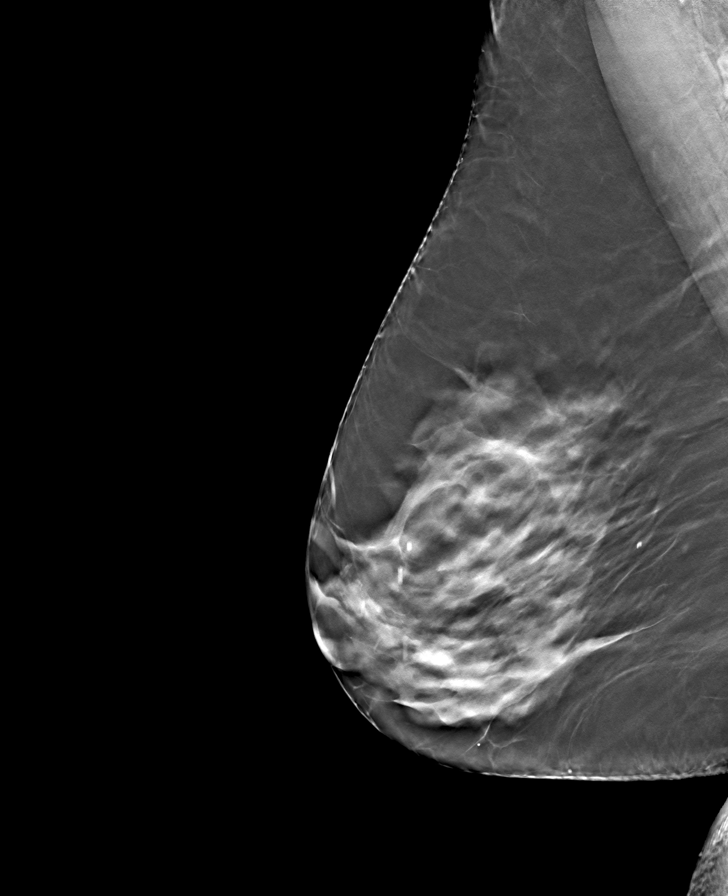

[L CC tomo · tomo slice 37/73.0]
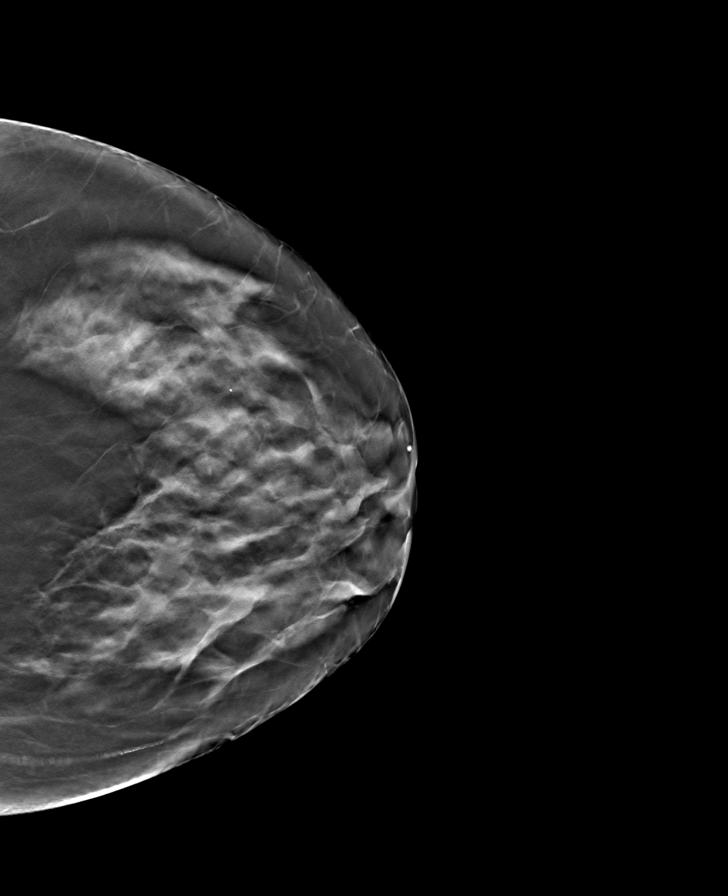

[L MLO tomo · tomo slice 39/78.0]
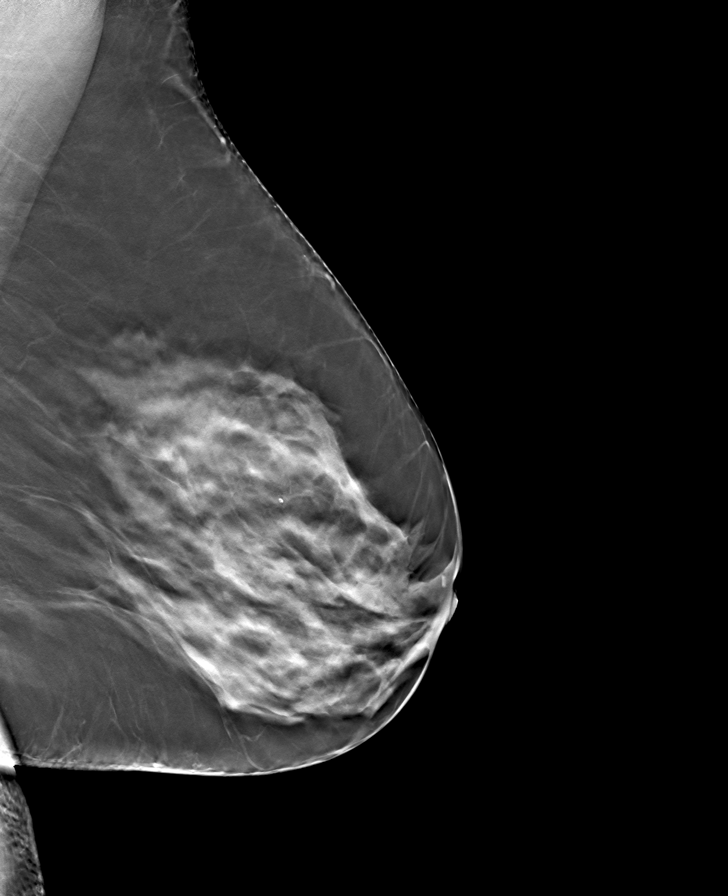

[8 of 24 positions shown; findings below may reference images not displayed]

ACR Breast Density Category c: The breast tissue is heterogeneously
dense, which may obscure small masses.
FINDINGS: There are no findings suspicious for malignancy.
IMPRESSION: No mammographic evidence of malignancy. A result letter of this
screening mammogram will be mailed directly to the patient.

RECOMMENDATION:
Screening mammogram in one year. (Code:Q3-W-BC3)

BI-RADS CATEGORY  1: Negative.

## 2024-01-23 IMAGING — CR DG CHEST 2V
1 series · 2 of 2 positions shown · non-contrast
Comparison: None.

CLINICAL DATA: Chest pain

EXAM:
CHEST - 2 VIEW

[Series 1: dg chest 2 view · 0.14mm/px · 2 of 2 slices shown]
[im 1/2]
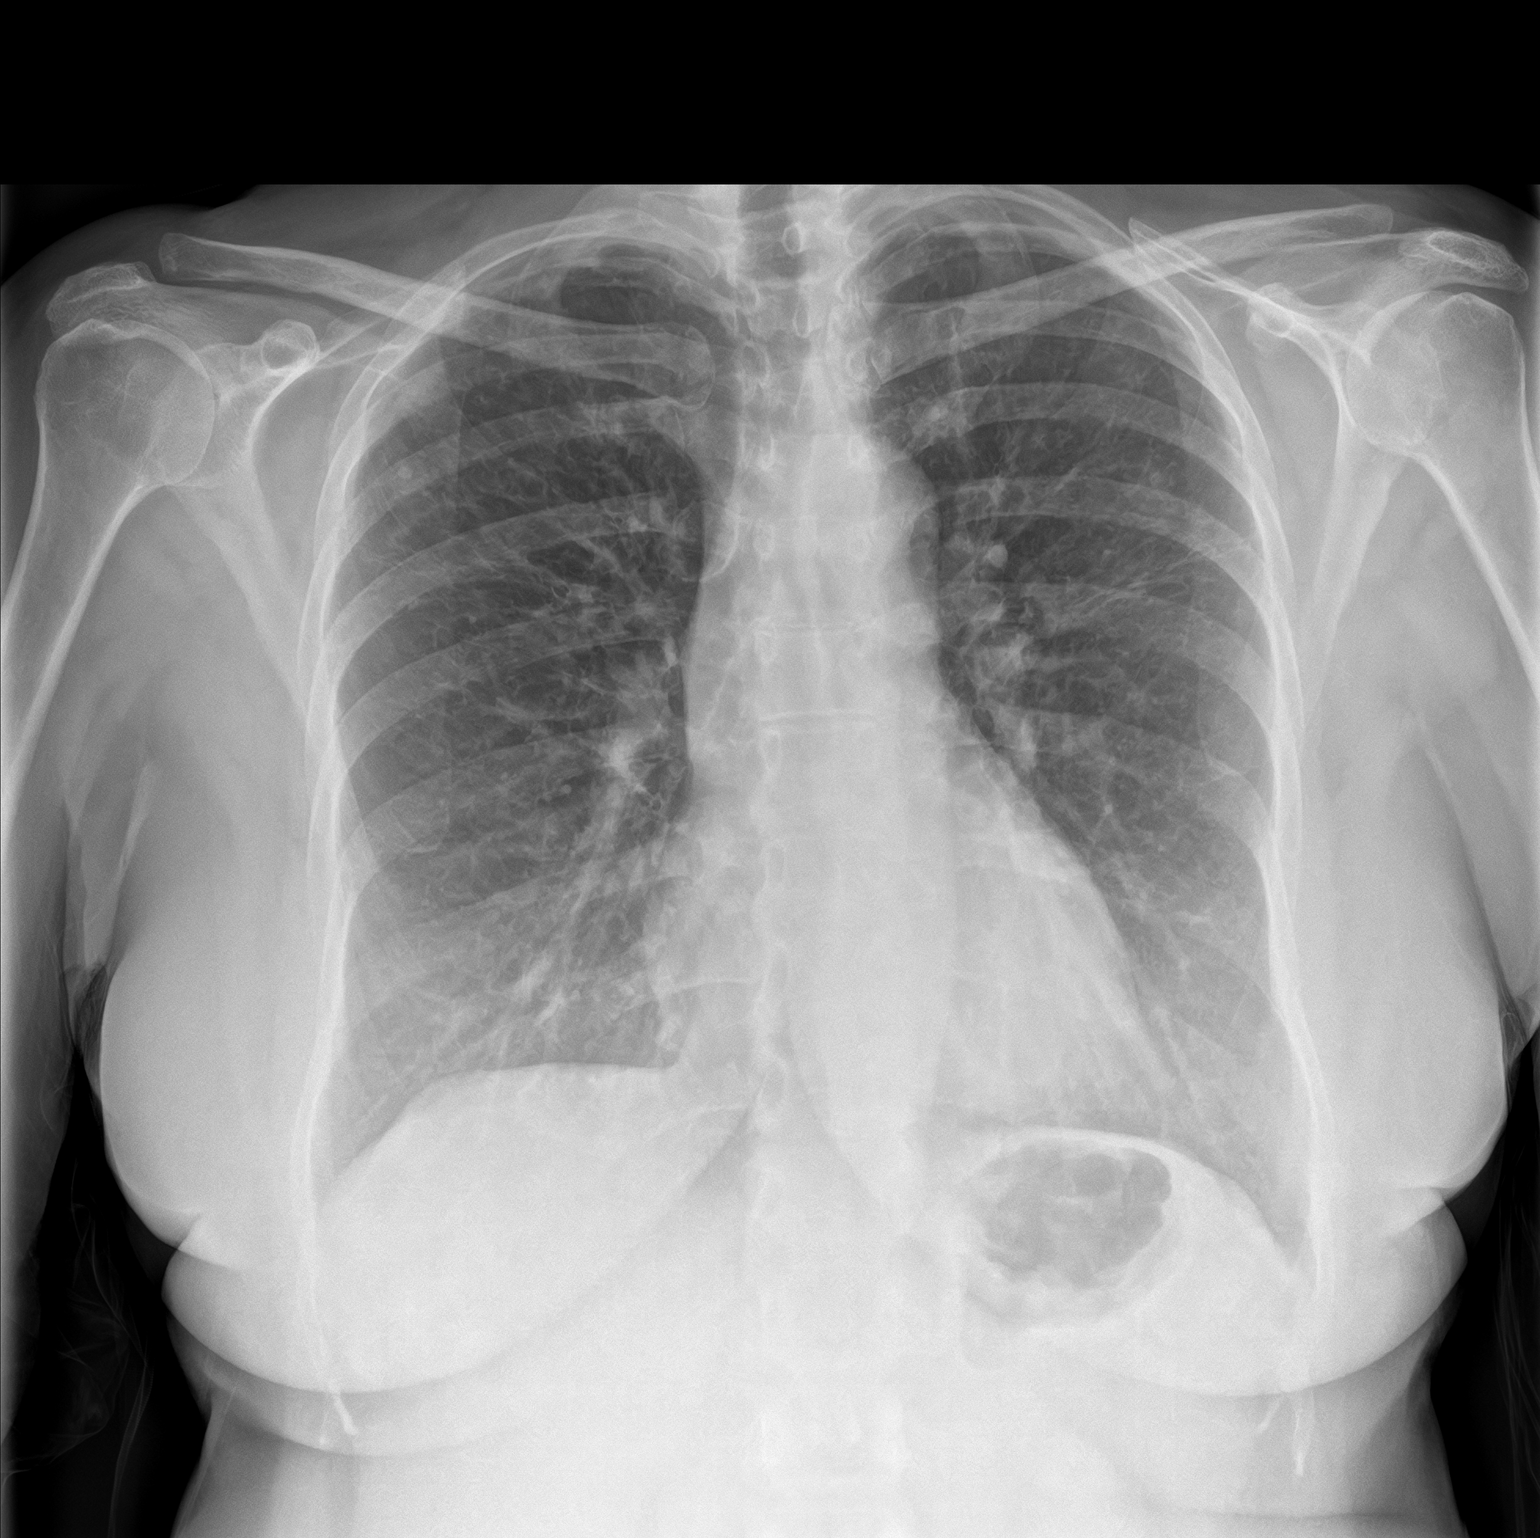
[im 2/2]
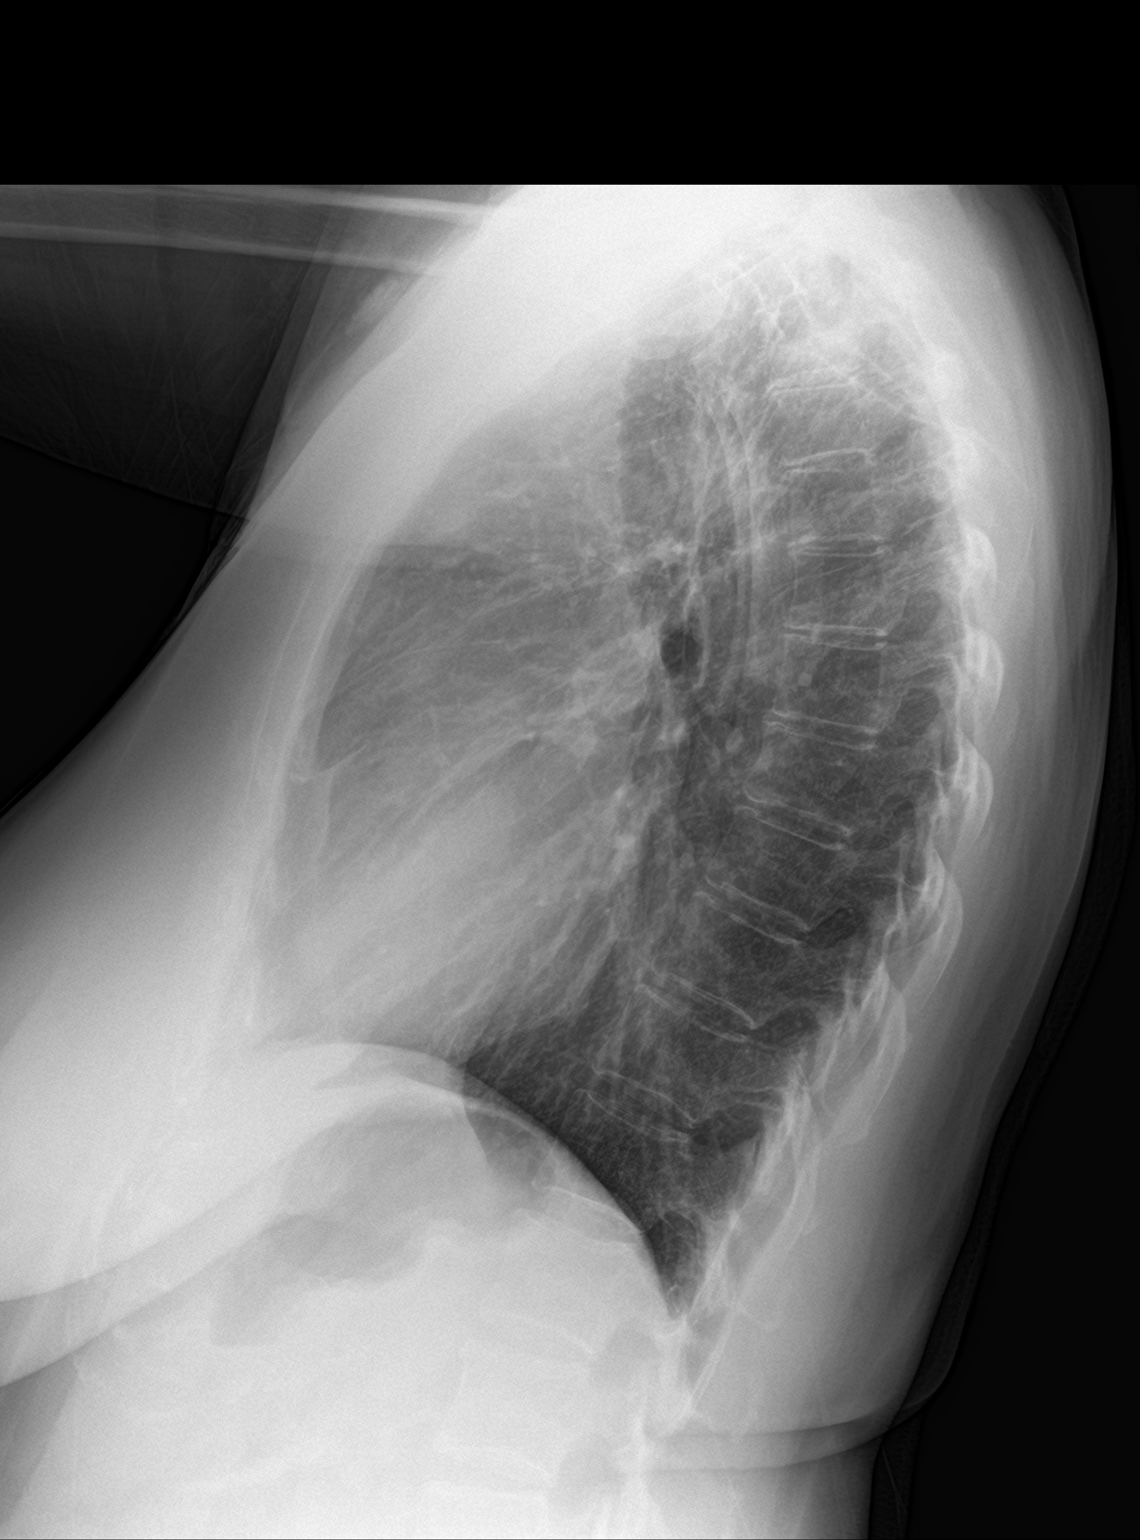

[2 of 2 positions shown; findings below may reference images not displayed]

FINDINGS: Cardiac and mediastinal contours are within normal limits. No focal
pulmonary opacity. No pleural effusion or pneumothorax. No acute
osseous abnormality.
IMPRESSION: No acute cardiopulmonary process.
